# Patient Record
Sex: Female | Born: 1955 | Race: White | Hispanic: No | Marital: Married | Smoking: Former smoker
Health system: Southern US, Community
[De-identification: ages and names within clinical notes are randomized; demographics above are authoritative.]

## PROBLEM LIST (undated history)

## (undated) DIAGNOSIS — K529 Noninfective gastroenteritis and colitis, unspecified: Secondary | ICD-10-CM

## (undated) DIAGNOSIS — I10 Essential (primary) hypertension: Secondary | ICD-10-CM

## (undated) DIAGNOSIS — K5792 Diverticulitis of intestine, part unspecified, without perforation or abscess without bleeding: Secondary | ICD-10-CM

## (undated) DIAGNOSIS — E785 Hyperlipidemia, unspecified: Secondary | ICD-10-CM

## (undated) HISTORY — PX: ABDOMINAL HYSTERECTOMY: SHX81

---

## 1980-03-21 HISTORY — PX: BREAST BIOPSY: SHX20

## 2010-11-02 ENCOUNTER — Ambulatory Visit: Payer: Self-pay | Admitting: Family Medicine

## 2011-11-08 ENCOUNTER — Ambulatory Visit: Payer: Self-pay | Admitting: Family Medicine

## 2012-11-09 ENCOUNTER — Ambulatory Visit: Payer: Self-pay | Admitting: Family Medicine

## 2013-11-13 ENCOUNTER — Ambulatory Visit: Payer: Self-pay | Admitting: Family Medicine

## 2014-11-19 ENCOUNTER — Other Ambulatory Visit: Payer: Self-pay | Admitting: Family Medicine

## 2014-11-19 DIAGNOSIS — Z1231 Encounter for screening mammogram for malignant neoplasm of breast: Secondary | ICD-10-CM

## 2014-11-20 ENCOUNTER — Ambulatory Visit
Admission: RE | Admit: 2014-11-20 | Discharge: 2014-11-20 | Disposition: A | Discharge status: Home / Self Care | Payer: Federal, State, Local not specified - PPO | Source: Ambulatory Visit | Attending: Family Medicine | Admitting: Family Medicine

## 2014-11-20 DIAGNOSIS — Z1231 Encounter for screening mammogram for malignant neoplasm of breast: Secondary | ICD-10-CM | POA: Insufficient documentation

## 2015-10-27 ENCOUNTER — Other Ambulatory Visit: Payer: Self-pay | Admitting: Family Medicine

## 2015-10-27 DIAGNOSIS — Z1231 Encounter for screening mammogram for malignant neoplasm of breast: Secondary | ICD-10-CM

## 2015-11-24 ENCOUNTER — Ambulatory Visit
Admission: RE | Admit: 2015-11-24 | Discharge: 2015-11-24 | Disposition: A | Discharge status: Home / Self Care | Payer: Federal, State, Local not specified - PPO | Source: Ambulatory Visit | Attending: Family Medicine | Admitting: Family Medicine

## 2015-11-24 ENCOUNTER — Other Ambulatory Visit: Payer: Self-pay | Admitting: Family Medicine

## 2015-11-24 DIAGNOSIS — Z1231 Encounter for screening mammogram for malignant neoplasm of breast: Secondary | ICD-10-CM | POA: Insufficient documentation

## 2015-12-12 ENCOUNTER — Encounter: Payer: Self-pay | Admitting: Emergency Medicine

## 2015-12-12 ENCOUNTER — Ambulatory Visit
Admission: EM | Admit: 2015-12-12 | Discharge: 2015-12-12 | Disposition: A | Discharge status: Other Acute Inpatient Hospital | Payer: Federal, State, Local not specified - PPO | Attending: Family Medicine | Admitting: Family Medicine

## 2015-12-12 ENCOUNTER — Emergency Department
Admission: EM | Admit: 2015-12-12 | Discharge: 2015-12-12 | Disposition: A | Discharge status: Other Acute Inpatient Hospital | Payer: Federal, State, Local not specified - PPO | Attending: Emergency Medicine | Admitting: Emergency Medicine

## 2015-12-12 DIAGNOSIS — K229 Disease of esophagus, unspecified: Secondary | ICD-10-CM | POA: Diagnosis not present

## 2015-12-12 DIAGNOSIS — T18128A Food in esophagus causing other injury, initial encounter: Secondary | ICD-10-CM | POA: Diagnosis not present

## 2015-12-12 DIAGNOSIS — R1319 Other dysphagia: Secondary | ICD-10-CM

## 2015-12-12 DIAGNOSIS — Y999 Unspecified external cause status: Secondary | ICD-10-CM | POA: Insufficient documentation

## 2015-12-12 DIAGNOSIS — Z87891 Personal history of nicotine dependence: Secondary | ICD-10-CM | POA: Diagnosis not present

## 2015-12-12 DIAGNOSIS — I10 Essential (primary) hypertension: Secondary | ICD-10-CM | POA: Insufficient documentation

## 2015-12-12 DIAGNOSIS — Y9389 Activity, other specified: Secondary | ICD-10-CM | POA: Insufficient documentation

## 2015-12-12 DIAGNOSIS — R079 Chest pain, unspecified: Secondary | ICD-10-CM | POA: Diagnosis present

## 2015-12-12 DIAGNOSIS — R1314 Dysphagia, pharyngoesophageal phase: Secondary | ICD-10-CM | POA: Diagnosis not present

## 2015-12-12 DIAGNOSIS — R1310 Dysphagia, unspecified: Secondary | ICD-10-CM

## 2015-12-12 DIAGNOSIS — F458 Other somatoform disorders: Secondary | ICD-10-CM | POA: Diagnosis not present

## 2015-12-12 DIAGNOSIS — Y9289 Other specified places as the place of occurrence of the external cause: Secondary | ICD-10-CM | POA: Insufficient documentation

## 2015-12-12 DIAGNOSIS — R0989 Other specified symptoms and signs involving the circulatory and respiratory systems: Secondary | ICD-10-CM | POA: Diagnosis present

## 2015-12-12 DIAGNOSIS — T18108A Unspecified foreign body in esophagus causing other injury, initial encounter: Secondary | ICD-10-CM

## 2015-12-12 DIAGNOSIS — X58XXXA Exposure to other specified factors, initial encounter: Secondary | ICD-10-CM | POA: Diagnosis not present

## 2015-12-12 HISTORY — DX: Noninfective gastroenteritis and colitis, unspecified: K52.9

## 2015-12-12 HISTORY — DX: Hyperlipidemia, unspecified: E78.5

## 2015-12-12 HISTORY — DX: Essential (primary) hypertension: I10

## 2015-12-12 HISTORY — DX: Diverticulitis of intestine, part unspecified, without perforation or abscess without bleeding: K57.92

## 2015-12-12 MED ORDER — SODIUM CHLORIDE 0.9 % IV BOLUS (SEPSIS)
1000.0000 mL | Freq: Once | INTRAVENOUS | Status: AC
  Administered 2015-12-12: 1000 mL via INTRAVENOUS

## 2015-12-12 MED ORDER — GLUCAGON HCL RDNA (DIAGNOSTIC) 1 MG IJ SOLR
1.0000 mg | Freq: Once | INTRAMUSCULAR | Status: AC
  Administered 2015-12-12: 1 mg via INTRAVENOUS

## 2015-12-12 MED ORDER — GLUCAGON HCL RDNA (DIAGNOSTIC) 1 MG IJ SOLR
1.0000 mg | Freq: Once | INTRAMUSCULAR | Status: DC
  Filled 2015-12-12: qty 1

## 2015-12-12 NOTE — ED Triage Notes (Signed)
States since yesterday after eating meat has had trouble with choking and bringing up small pieces of meat or phlegm. Unable to swollow fluids including own saliva.

## 2015-12-12 NOTE — ED Notes (Signed)
Report given to [facility] at [facility]

## 2015-12-12 NOTE — ED Notes (Signed)
Called [facility] transfer center for GI [phone]

## 2015-12-12 NOTE — ED Triage Notes (Signed)
Patient states that she feels like she has something stuck in her throat.  Patient reports some difficulty swallowing.  Patient reports pain that is in there throat and goes down to her chest.  Patient reports SOB.  Patient reports nausea.

## 2015-12-12 NOTE — ED Notes (Signed)
Pt got choked on food.  Able to talk without difficulty but difficulty swallowing secretions .  Dr. Shaune PollackLord at bedside.

## 2015-12-12 NOTE — ED Provider Notes (Signed)
[facility]    CSN: [id number] Arrival date & time: 12/12/15  1319  First Provider Contact:  None       History   Chief Complaint Chief Complaint  Patient presents with  . Chest Pain    HPI Debbie Merritt is a 60 y.o. female.   60 yo female with a c/o difficulty swallowing and mid chest discomfort with swallowing. States symptoms started last night around 7pm after eating steak for dinner. States since then she has tried drinking fluids but they "come back up".    The history is provided by the patient.  Chest Pain  Pain location:  Substernal area and epigastric   Past Medical History:  Diagnosis Date  . Colitis   . Diverticulitis   . Hyperlipidemia   . Hypertension     There are no active problems to display for this patient.   Past Surgical History:  Procedure Laterality Date  . ABDOMINAL HYSTERECTOMY    . BREAST BIOPSY Left 1982   neg    OB History    No data available       Home Medications    Prior to Admission medications   Medication Sig Start Date End Date Taking? Authorizing Provider  losartan (COZAAR) 25 MG tablet Take 25 mg by mouth daily.   Yes Historical Provider, MD  lovastatin (MEVACOR) 10 MG tablet Take 10 mg by mouth at bedtime.   Yes Historical Provider, MD    Family History Family History  Problem Relation Age of Onset  . Breast cancer Paternal Aunt 7550    breast ca twice 50's and 370's    Social History Social History  Substance Use Topics  . Smoking status: Former Games developermoker  . Smokeless tobacco: Never Used  . Alcohol use Yes     Allergies   Review of patient's allergies indicates no known allergies.   Review of Systems Review of Systems  Cardiovascular: Positive for chest pain.     Physical Exam Triage Vital Signs ED Triage Vitals  Enc Vitals Group     BP 12/12/15 1330 (!) 167/90     Pulse Rate 12/12/15 1330 94     Resp 12/12/15 1330 18     Temp 12/12/15 1330 98 F (36.7 C)     Temp Source  12/12/15 1330 Oral     SpO2 12/12/15 1330 99 %     Weight 12/12/15 1330 170 lb (77.1 kg)     Height 12/12/15 1330 5\' 4"  (1.626 m)     Head Circumference --      Peak Flow --      Pain Score 12/12/15 1332 7     Pain Loc --      Pain Edu? --      Excl. in GC? --    No data found.   Updated Vital Signs BP (!) 167/90 (BP Location: Right Arm)   Pulse 94   Temp 98 F (36.7 C) (Oral)   Resp 18   Ht 5\' 4"  (1.626 m)   Wt 170 lb (77.1 kg)   SpO2 99%   BMI 29.18 kg/m   Visual Acuity Right Eye Distance:   Left Eye Distance:   Bilateral Distance:    Right Eye Near:   Left Eye Near:    Bilateral Near:     Physical Exam  Constitutional: She appears well-developed and well-nourished. No distress.  HENT:  Head: Normocephalic and atraumatic.  Right Ear: Tympanic membrane, external ear and ear  canal normal.  Left Ear: Tympanic membrane, external ear and ear canal normal.  Nose: Mucosal edema and rhinorrhea present. No nose lacerations, sinus tenderness, nasal deformity, septal deviation or nasal septal hematoma. No epistaxis.  No foreign bodies. Right sinus exhibits maxillary sinus tenderness and frontal sinus tenderness. Left sinus exhibits maxillary sinus tenderness and frontal sinus tenderness.  Mouth/Throat: Uvula is midline, oropharynx is clear and moist and mucous membranes are normal. No oropharyngeal exudate.  Neck: Normal range of motion. Neck supple. No JVD present. No tracheal deviation present. No thyromegaly present.  Cardiovascular: Normal rate, regular rhythm and normal heart sounds.   Pulmonary/Chest: Effort normal and breath sounds normal. No stridor. No respiratory distress. She has no wheezes. She has no rales.  Abdominal: Soft. There is tenderness (epigastric). There is no rebound and no guarding.  Lymphadenopathy:    She has no cervical adenopathy.  Skin: She is not diaphoretic.  Nursing note and vitals reviewed.    UC Treatments / Results  Labs (all labs  ordered are listed, but only abnormal results are displayed) Labs Reviewed - No data to display  EKG  EKG Interpretation None       Radiology No results found.  Procedures .EKG Date/Time: 12/12/2015 1:58 PM Performed by: Payton Mccallum Authorized by: Payton Mccallum   ECG reviewed by ED Physician in the absence of a cardiologist: yes   Previous ECG:    Previous ECG:  Unavailable Interpretation:    Interpretation: normal   Rate:    ECG rate assessment: normal   Rhythm:    Rhythm: sinus rhythm   Ectopy:    Ectopy: none   QRS:    QRS axis:  Normal Conduction:    Conduction: normal   ST segments:    ST segments:  Normal T waves:    T waves: normal     (including critical care time)  Medications Ordered in UC Medications - No data to display   Initial Impression / Assessment and Plan / UC Course  I have reviewed the triage vital signs and the nursing notes.  Pertinent labs & imaging results that were available during my care of the patient were reviewed by me and considered in my medical decision making (see chart for details).  Clinical Course      Final Clinical Impressions(s) / UC Diagnoses   Final diagnoses:  Globus sensation  Esophageal disorder  Esophageal dysphagia    New Prescriptions Discharge Medication List as of 12/12/2015  1:54 PM      Discussed with patient possible etiology. Recommend patient go to ED for further evaluation and management. Patient stable and will go to ED by private vehicle (husband driving).  Report called to triage RN at [facility] ED.    Payton Mccallum, MD 12/12/15 [phone]

## 2015-12-12 NOTE — ED Provider Notes (Signed)
[facility] Emergency Department Provider Note ____________________________________________   I have reviewed the triage vital signs and the triage nursing note.  HISTORY  Chief Complaint Choking   Historian Patient and husband  HPI Debbie Merritt is a 60 y.o. female here for esophageal foreign body.Had steak and sweet potatoes last night.  Last night had some trouble with few pieces of steak coming back up.  Some moderate discomfort in the mid/low epigastrium.  Symptoms started last night around 7 PM. Symptoms are persistent. She is still spitting up secretions. She tried drinking some coffee this morning and came right back up. Symptoms are moderate to severe. She was seen at urgent care and sent here.    Past Medical History:  Diagnosis Date  . Colitis   . Diverticulitis   . Hyperlipidemia   . Hypertension     There are no active problems to display for this patient.   Past Surgical History:  Procedure Laterality Date  . ABDOMINAL HYSTERECTOMY    . BREAST BIOPSY Left 1982   neg    Prior to Admission medications   Medication Sig Start Date End Date Taking? Authorizing Provider  losartan (COZAAR) 25 MG tablet Take 25 mg by mouth daily.    Historical Provider, MD  lovastatin (MEVACOR) 10 MG tablet Take 10 mg by mouth at bedtime.    Historical Provider, MD    No Known Allergies  Family History  Problem Relation Age of Onset  . Breast cancer Paternal Aunt 5850    breast ca twice 50's and 2370's    Social History Social History  Substance Use Topics  . Smoking status: Former Games developermoker  . Smokeless tobacco: Never Used  . Alcohol use Yes    Review of Systems  Constitutional: Negative for fever. Eyes: Negative for visual changes. ENT: Negative for sore throat. Cardiovascular: Negative for palpitations. Respiratory: Negative for shortness of breath. Gastrointestinal: Negative for diarrhea. Genitourinary: Negative for  dysuria. Musculoskeletal: Negative for back pain. Skin: Negative for rash. Neurological: Negative for headache. 10 point Review of Systems otherwise negative ____________________________________________   PHYSICAL EXAM:  VITAL SIGNS: ED Triage Vitals  Enc Vitals Group     BP 12/12/15 1419 (!) 156/110     Pulse Rate 12/12/15 1419 83     Resp 12/12/15 1419 18     Temp 12/12/15 1419 98.3 F (36.8 C)     Temp Source 12/12/15 1419 Oral     SpO2 12/12/15 1419 98 %     Weight 12/12/15 1421 170 lb (77.1 kg)     Height 12/12/15 1421 5\' 4"  (1.626 m)     Head Circumference --      Peak Flow --      Pain Score 12/12/15 1421 7     Pain Loc --      Pain Edu? --      Excl. in GC? --      Constitutional: Alert and oriented. Well appearing and in no distress. HEENT   Head: Normocephalic and atraumatic.      Eyes: Conjunctivae are normal. PERRL. Normal extraocular movements.      Ears:         Nose: No congestion/rhinnorhea.   Mouth/Throat: Mucous membranes are moist.   Neck: No stridor. Cardiovascular/Chest: Normal rate, regular rhythm.  No murmurs, rubs, or gallops. Respiratory: Normal respiratory effort without tachypnea nor retractions. Breath sounds are clear and equal bilaterally. No wheezes/rales/rhonchi. Gastrointestinal: Soft. No distention, no guarding, no rebound. Nontender.  Genitourinary/rectal:Deferred Musculoskeletal: Nontender with normal range of motion in all extremities. Neurologic:  Normal speech and language. No gross or focal neurologic deficits are appreciated. Skin:  Skin is warm, dry and intact. No rash noted. Psychiatric: Mood and affect are normal. Speech and behavior are normal. Patient exhibits appropriate insight and judgment.   ____________________________________________  LABS (pertinent positives/negatives)  Labs Reviewed - No data to display  ____________________________________________    EKG I, Governor Rooks, MD, the attending  physician have personally viewed and interpreted all ECGs.  83 bpm. Normal sinus rhythm. Narrow QRS. Normal axis. Normal ST and T-wave ____________________________________________  RADIOLOGY All Xrays were viewed by me. Imaging interpreted by Radiologist.  None __________________________________________  PROCEDURES  Procedure(s) performed: None  Critical Care performed: None  ____________________________________________   ED COURSE / ASSESSMENT AND PLAN  Pertinent labs & imaging results that were available during my care of the patient were reviewed by me and considered in my medical decision making (see chart for details).   Debbie Merritt was referred to our ED from urgent care with history consistent with esophageal foreign body, likely the steak from last night. She is still spitting up secretions, I will try a dose of IM glucagon here. Unfortunately, we have no gastrointestinal coverage today and so she will need to be transferred. I offered her her choice and she requested [facility] for transfer.  No airway concerns.  She is stable.    I spoke with the ED attending Dr. Marcine Matar at [facility] for transfer.  In order to expedite care, ED physician recommended starting IV fluid bolus and transferred by ambulance.    CONSULTATIONS:  [facility] ED for ED to ED transfer.   Patient / Family / Caregiver informed of clinical course, medical decision-making process, and agree with plan.    ___________________________________________   FINAL CLINICAL IMPRESSION(S) / ED DIAGNOSES   Final diagnoses:  Esophageal foreign body, initial encounter              Note: This dictation was prepared with Dragon dictation. Any transcriptional errors that result from this process are unintentional    Governor Rooks, MD 12/12/15 1526

## 2016-10-31 ENCOUNTER — Other Ambulatory Visit: Payer: Self-pay | Admitting: Family Medicine

## 2016-10-31 DIAGNOSIS — Z1231 Encounter for screening mammogram for malignant neoplasm of breast: Secondary | ICD-10-CM

## 2016-11-28 ENCOUNTER — Ambulatory Visit
Admission: RE | Admit: 2016-11-28 | Discharge: 2016-11-28 | Disposition: A | Discharge status: Home / Self Care | Payer: Federal, State, Local not specified - PPO | Source: Ambulatory Visit | Attending: Family Medicine | Admitting: Family Medicine

## 2016-11-28 DIAGNOSIS — Z1231 Encounter for screening mammogram for malignant neoplasm of breast: Secondary | ICD-10-CM | POA: Insufficient documentation

## 2017-11-02 ENCOUNTER — Other Ambulatory Visit: Payer: Self-pay | Admitting: Family Medicine

## 2017-11-02 DIAGNOSIS — Z1231 Encounter for screening mammogram for malignant neoplasm of breast: Secondary | ICD-10-CM

## 2017-11-30 ENCOUNTER — Encounter (INDEPENDENT_AMBULATORY_CARE_PROVIDER_SITE_OTHER): Payer: Self-pay

## 2017-11-30 ENCOUNTER — Ambulatory Visit
Admission: RE | Admit: 2017-11-30 | Discharge: 2017-11-30 | Disposition: A | Discharge status: Home / Self Care | Payer: Federal, State, Local not specified - PPO | Source: Ambulatory Visit | Attending: Family Medicine | Admitting: Family Medicine

## 2017-11-30 DIAGNOSIS — Z1231 Encounter for screening mammogram for malignant neoplasm of breast: Secondary | ICD-10-CM | POA: Diagnosis not present

## 2019-06-07 ENCOUNTER — Other Ambulatory Visit: Payer: Self-pay

## 2019-06-07 ENCOUNTER — Ambulatory Visit: Attending: Internal Medicine

## 2019-06-07 DIAGNOSIS — Z23 Encounter for immunization: Secondary | ICD-10-CM

## 2019-06-07 NOTE — Progress Notes (Signed)
   Covid-19 Vaccination Clinic  Name:  KERIE BADGER    MRN: [id number] DOB: July 17, 1955  06/07/2019  Ms. Pidgeon was observed post Covid-19 immunization for 15 minutes without incident. She was provided with Vaccine Information Sheet and instruction to access the V-Safe system.   Ms. Floresca was instructed to call 911 with any severe reactions post vaccine: Marland Kitchen Difficulty breathing  . Swelling of face and throat  . A fast heartbeat  . A bad rash all over body  . Dizziness and weakness   Immunizations Administered    Name Date Dose VIS Date Route   Pfizer COVID-19 Vaccine 06/07/2019 12:37 PM 0.3 mL 03/01/2019 Intramuscular   Manufacturer: [employer], [employer]   Lot: [id number]   NDC: [address]-9

## 2019-07-02 ENCOUNTER — Ambulatory Visit: Attending: Internal Medicine

## 2019-07-02 DIAGNOSIS — Z23 Encounter for immunization: Secondary | ICD-10-CM

## 2019-07-02 NOTE — Progress Notes (Signed)
   Covid-19 Vaccination Clinic  Name:  Debbie Merritt    MRN: [id number] DOB: 06/09/1955  07/02/2019  [name] was observed post Covid-19 immunization for 15 minutes without incident. She was provided with Vaccine Information Sheet and instruction to access the V-Safe system.   [name] was instructed to call 911 with any severe reactions post vaccine: Marland Kitchen Difficulty breathing  . Swelling of face and throat  . A fast heartbeat  . A bad rash all over body  . Dizziness and weakness   Immunizations Administered    Name Date Dose VIS Date Route   Pfizer COVID-19 Vaccine 07/02/2019 12:50 PM 0.3 mL 03/01/2019 Intramuscular   Manufacturer: [employer], [employer]   Lot: [id number]   NDC: [address]-5

## 2019-12-05 ENCOUNTER — Other Ambulatory Visit: Payer: Self-pay | Admitting: Family Medicine

## 2019-12-05 DIAGNOSIS — Z1231 Encounter for screening mammogram for malignant neoplasm of breast: Secondary | ICD-10-CM

## 2019-12-17 ENCOUNTER — Other Ambulatory Visit: Payer: Self-pay

## 2019-12-17 ENCOUNTER — Ambulatory Visit
Admission: RE | Admit: 2019-12-17 | Discharge: 2019-12-17 | Disposition: A | Discharge status: Home / Self Care | Payer: Medicare Other | Source: Ambulatory Visit | Attending: Family Medicine | Admitting: Family Medicine

## 2019-12-17 DIAGNOSIS — Z1231 Encounter for screening mammogram for malignant neoplasm of breast: Secondary | ICD-10-CM | POA: Insufficient documentation

## 2020-06-15 ENCOUNTER — Other Ambulatory Visit: Payer: Self-pay | Admitting: Family Medicine

## 2020-06-15 DIAGNOSIS — Z780 Asymptomatic menopausal state: Secondary | ICD-10-CM

## 2020-06-23 ENCOUNTER — Ambulatory Visit
Admission: RE | Admit: 2020-06-23 | Discharge: 2020-06-23 | Disposition: A | Discharge status: Home / Self Care | Payer: Medicare Other | Source: Ambulatory Visit | Attending: Family Medicine | Admitting: Family Medicine

## 2020-06-23 ENCOUNTER — Other Ambulatory Visit: Payer: Self-pay

## 2020-06-23 DIAGNOSIS — Z780 Asymptomatic menopausal state: Secondary | ICD-10-CM | POA: Insufficient documentation

## 2021-12-08 ENCOUNTER — Other Ambulatory Visit: Payer: Self-pay | Admitting: Family Medicine

## 2021-12-08 DIAGNOSIS — Z1231 Encounter for screening mammogram for malignant neoplasm of breast: Secondary | ICD-10-CM

## 2021-12-28 ENCOUNTER — Ambulatory Visit
Admission: RE | Admit: 2021-12-28 | Discharge: 2021-12-28 | Disposition: A | Discharge status: Home / Self Care | Payer: Medicare Other | Source: Ambulatory Visit | Attending: Family Medicine | Admitting: Family Medicine

## 2021-12-28 DIAGNOSIS — Z1231 Encounter for screening mammogram for malignant neoplasm of breast: Secondary | ICD-10-CM | POA: Diagnosis present

## 2022-02-15 IMAGING — MG DIGITAL SCREENING BILAT W/ TOMO W/ CAD
8 series · 8 of 24 positions shown · non-contrast
Comparison: Previous exam(s).

CLINICAL DATA: Screening.

EXAM:
DIGITAL SCREENING BILATERAL MAMMOGRAM WITH TOMO AND CAD

[R MLO synth-2D]
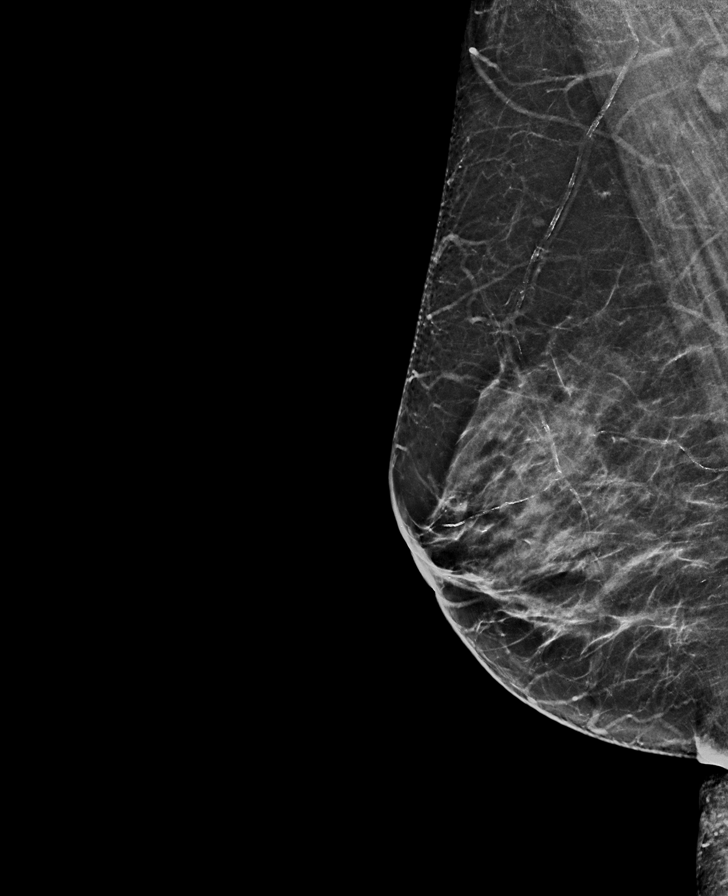

[L MLO synth-2D]
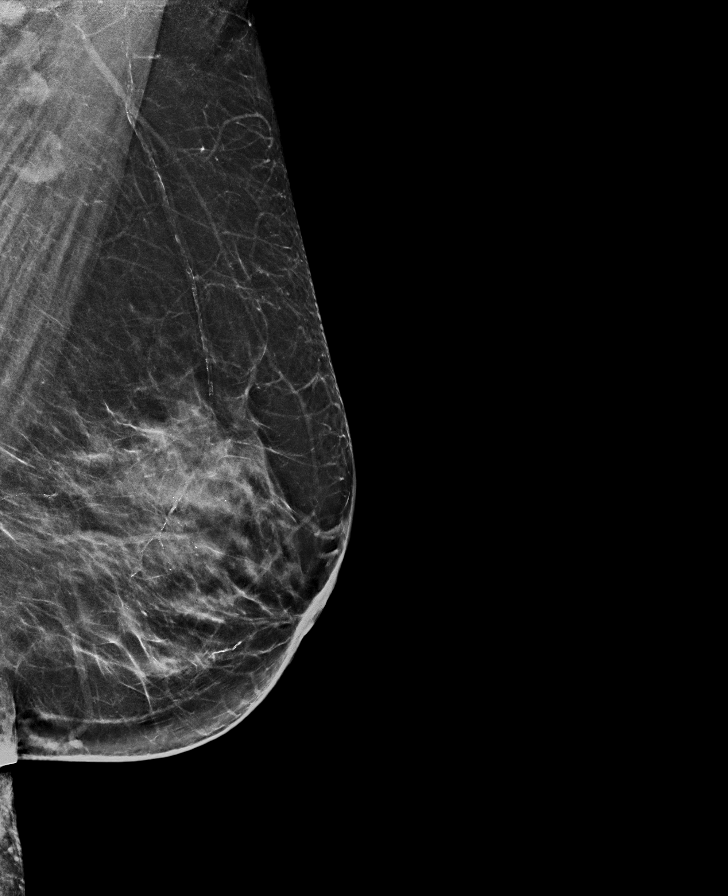

[L CC synth-2D]
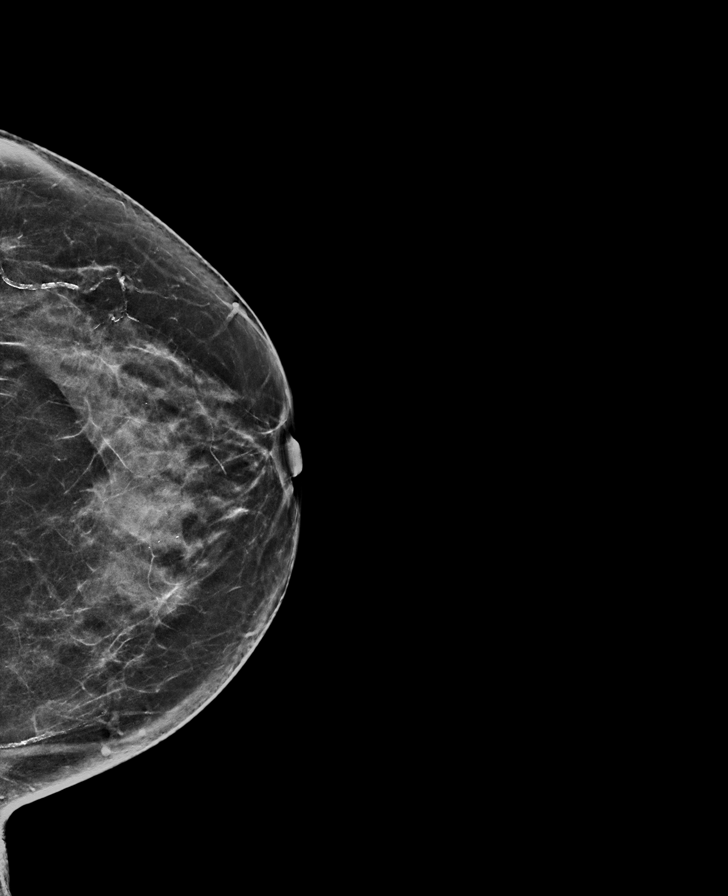

[R CC synth-2D]
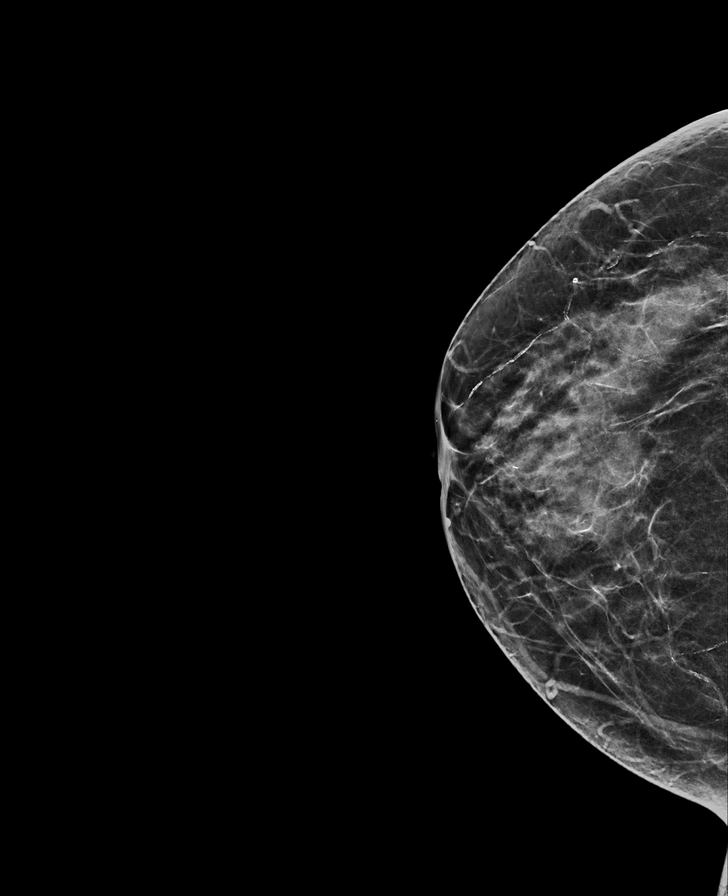

[L CC tomo · tomo slice 34/67.0]
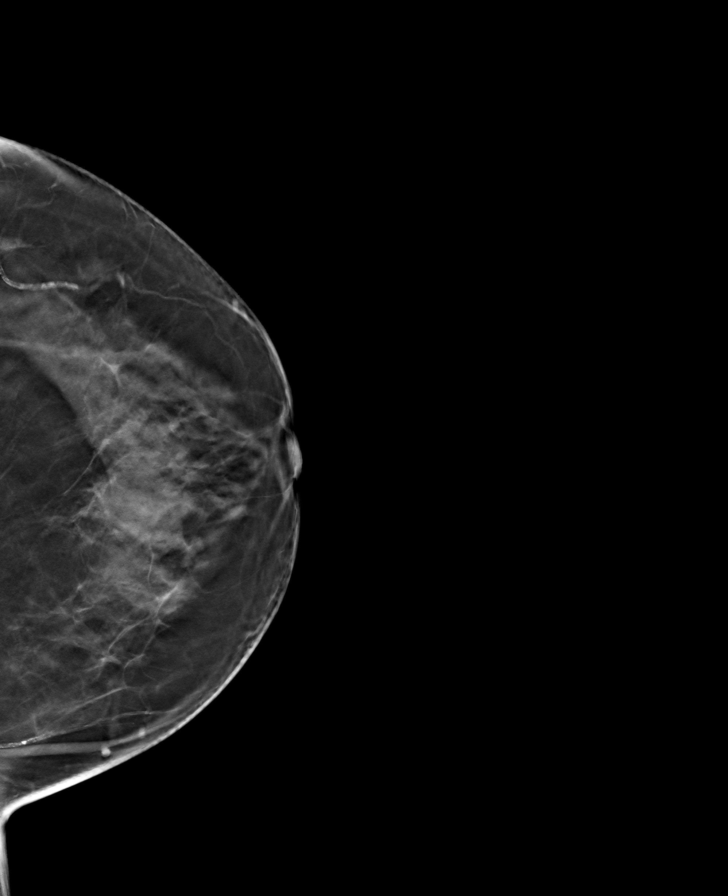

[R CC tomo · tomo slice 31/62.0]
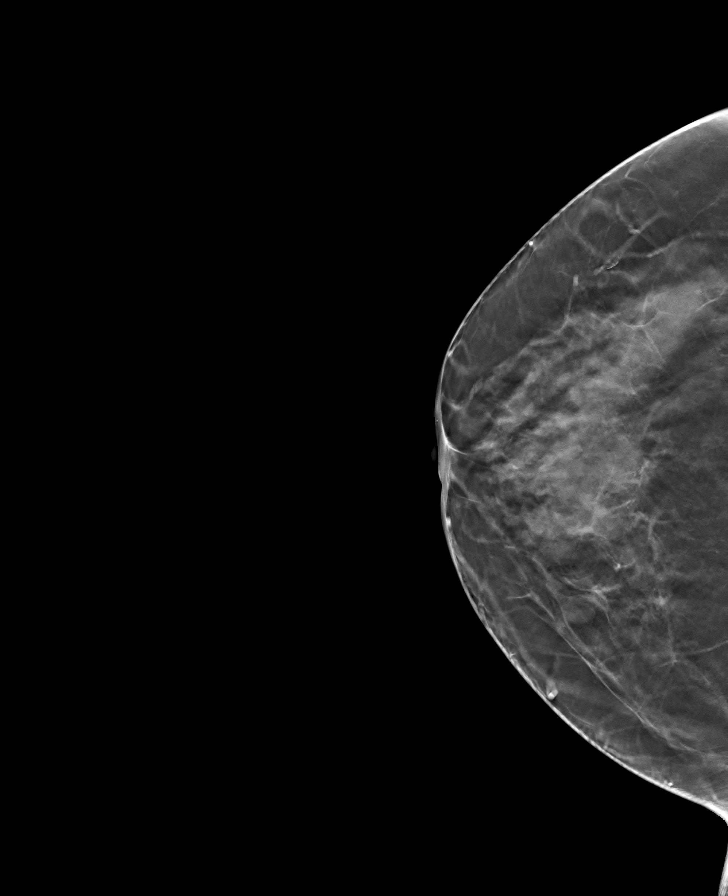

[L MLO tomo · tomo slice 36/71.0]
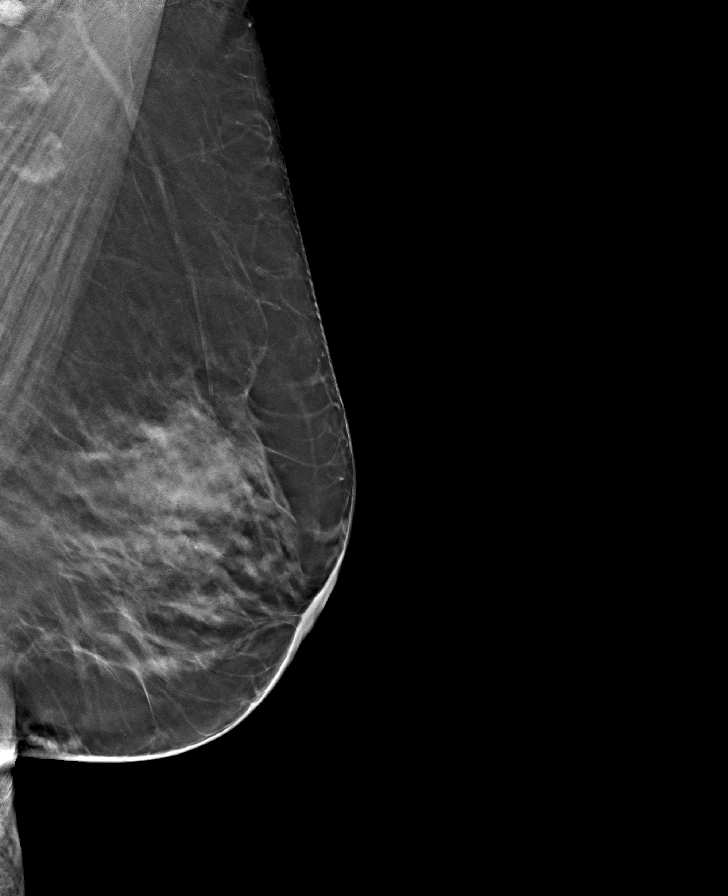

[R MLO tomo · tomo slice 33/65.0]
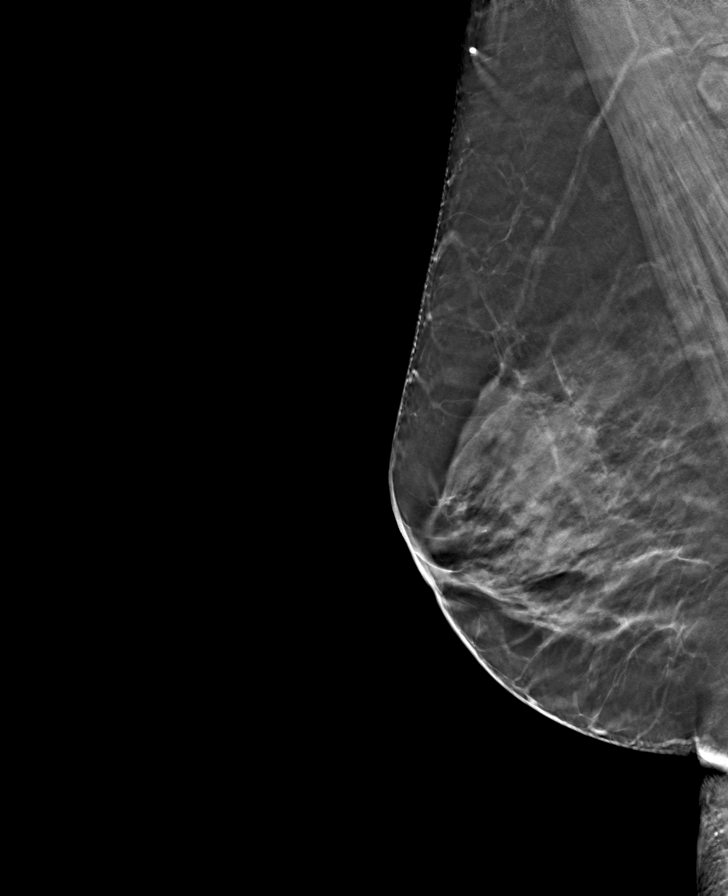

[8 of 24 positions shown; findings below may reference images not displayed]

ACR Breast Density Category c: The breast tissue is heterogeneously
dense, which may obscure small masses.
FINDINGS: There are no findings suspicious for malignancy. Images were
processed with CAD.
IMPRESSION: No mammographic evidence of malignancy. A result letter of this
screening mammogram will be mailed directly to the patient.

RECOMMENDATION:
Screening mammogram in one year. (Code:[id number])

BI-RADS CATEGORY  1: Negative.

## 2022-06-13 ENCOUNTER — Other Ambulatory Visit: Payer: Self-pay | Admitting: Family Medicine

## 2022-06-13 DIAGNOSIS — Z13820 Encounter for screening for osteoporosis: Secondary | ICD-10-CM

## 2022-06-13 DIAGNOSIS — M8580 Other specified disorders of bone density and structure, unspecified site: Secondary | ICD-10-CM

## 2022-08-17 ENCOUNTER — Ambulatory Visit
Admission: RE | Admit: 2022-08-17 | Discharge: 2022-08-17 | Disposition: A | Discharge status: Home / Self Care | Payer: Medicare Other | Source: Ambulatory Visit | Attending: Family Medicine | Admitting: Family Medicine

## 2022-08-17 DIAGNOSIS — M8589 Other specified disorders of bone density and structure, multiple sites: Secondary | ICD-10-CM | POA: Diagnosis not present

## 2022-08-17 DIAGNOSIS — Z13820 Encounter for screening for osteoporosis: Secondary | ICD-10-CM | POA: Insufficient documentation

## 2022-08-17 DIAGNOSIS — M8580 Other specified disorders of bone density and structure, unspecified site: Secondary | ICD-10-CM

## 2022-08-17 DIAGNOSIS — Z780 Asymptomatic menopausal state: Secondary | ICD-10-CM | POA: Insufficient documentation

## 2023-10-11 LAB — COLOGUARD: COLOGUARD: NEGATIVE

## 2023-11-14 ENCOUNTER — Other Ambulatory Visit: Payer: Self-pay | Admitting: Family Medicine

## 2023-11-14 DIAGNOSIS — Z1231 Encounter for screening mammogram for malignant neoplasm of breast: Secondary | ICD-10-CM

## 2024-01-01 ENCOUNTER — Ambulatory Visit
Admission: RE | Admit: 2024-01-01 | Discharge: 2024-01-01 | Disposition: A | Discharge status: Home / Self Care | Source: Ambulatory Visit | Attending: Family Medicine | Admitting: Family Medicine

## 2024-01-01 DIAGNOSIS — Z1231 Encounter for screening mammogram for malignant neoplasm of breast: Secondary | ICD-10-CM | POA: Insufficient documentation
